# Patient Record
Sex: Female | Born: 1945 | Race: White | Hispanic: No | Marital: Single | Smoking: Former smoker
Health system: Southern US, Community
[De-identification: ages and names within clinical notes are randomized; demographics above are authoritative.]

## PROBLEM LIST (undated history)

## (undated) DIAGNOSIS — E7800 Pure hypercholesterolemia, unspecified: Secondary | ICD-10-CM

## (undated) HISTORY — PX: NECK SURGERY: SHX720

## (undated) HISTORY — PX: BACK SURGERY: SHX140

---

## 2018-11-04 ENCOUNTER — Emergency Department
Admission: EM | Admit: 2018-11-04 | Discharge: 2018-11-04 | Disposition: A | Discharge status: Home / Self Care | Payer: Medicare Other | Attending: Emergency Medicine | Admitting: Emergency Medicine

## 2018-11-04 ENCOUNTER — Encounter: Payer: Self-pay | Admitting: Emergency Medicine

## 2018-11-04 ENCOUNTER — Emergency Department: Payer: Medicare Other

## 2018-11-04 ENCOUNTER — Other Ambulatory Visit: Payer: Self-pay

## 2018-11-04 DIAGNOSIS — S76211A Strain of adductor muscle, fascia and tendon of right thigh, initial encounter: Secondary | ICD-10-CM | POA: Diagnosis not present

## 2018-11-04 DIAGNOSIS — Y998 Other external cause status: Secondary | ICD-10-CM | POA: Diagnosis not present

## 2018-11-04 DIAGNOSIS — Y929 Unspecified place or not applicable: Secondary | ICD-10-CM | POA: Diagnosis not present

## 2018-11-04 DIAGNOSIS — S79921A Unspecified injury of right thigh, initial encounter: Secondary | ICD-10-CM | POA: Diagnosis present

## 2018-11-04 DIAGNOSIS — W19XXXA Unspecified fall, initial encounter: Secondary | ICD-10-CM | POA: Diagnosis not present

## 2018-11-04 DIAGNOSIS — Z87891 Personal history of nicotine dependence: Secondary | ICD-10-CM | POA: Insufficient documentation

## 2018-11-04 DIAGNOSIS — S76219A Strain of adductor muscle, fascia and tendon of unspecified thigh, initial encounter: Secondary | ICD-10-CM

## 2018-11-04 DIAGNOSIS — Y939 Activity, unspecified: Secondary | ICD-10-CM | POA: Insufficient documentation

## 2018-11-04 HISTORY — DX: Pure hypercholesterolemia, unspecified: E78.00

## 2018-11-04 MED ORDER — BACLOFEN 10 MG PO TABS: 10.0000 mg | ORAL_TABLET | Freq: Three times a day (TID) | ORAL | 0 refills | Status: AC

## 2018-11-04 NOTE — ED Provider Notes (Signed)
[facility] Emergency Department Provider Note  ____________________________________________   First MD Initiated Contact with Patient 11/04/18 1236     (approximate)  I have reviewed the triage vital signs and the nursing notes.   HISTORY  Chief Complaint Fall    HPI Emylia Piquette is a 73 y.o. female presents to the emergency department after a fall about 2 weeks ago.  She states she was evaluated [facility] and x-rays were completed.  She states they told her they were negative.  She is continued to have increasing pain in the right hip.  She states it is all in the groin area.  Her family wanted her to be evaluated to make sure she did not have a fracture at the urgent care missed.  She states she is on a pain contract and does not want pain medications.    Past Medical History:  Diagnosis Date  . High cholesterol     There are no active problems to display for this patient.   Past Surgical History:  Procedure Laterality Date  . BACK SURGERY    . NECK SURGERY      Prior to Admission medications   Medication Sig Start Date End Date Taking? Authorizing Provider  DULoxetine (CYMBALTA) 60 MG capsule Take 60 mg by mouth daily.   Yes [provider]  ibuprofen (ADVIL,MOTRIN) 800 MG tablet Take 800 mg by mouth every 8 (eight) hours as needed.   Yes [provider]  oxyCODONE (OXYCONTIN) 10 mg 12 hr tablet Take 5 mg by mouth every 12 (twelve) hours.   Yes [provider]  oxyCODONE-acetaminophen (PERCOCET/ROXICET) 5-325 MG tablet Take 1 tablet by mouth every 4 (four) hours as needed for severe pain.   Yes [provider]  simvastatin (ZOCOR) 40 MG tablet Take 40 mg by mouth daily.   Yes [provider]  baclofen (LIORESAL) 10 MG tablet Take 1 tablet (10 mg total) by mouth 3 (three) times daily. 11/04/18   Kem Boroughs B, FNP    Allergies Cortisone  No family history on file.  Social  History Social History   Tobacco Use  . Smoking status: Former Games developer  . Smokeless tobacco: Never Used  Substance Use Topics  . Alcohol use: Never    Frequency: Never  . Drug use: Not on file    Review of Systems  Constitutional: No fever/chills Eyes: No visual changes. ENT: No sore throat. Respiratory: Denies cough Genitourinary: Negative for dysuria. Musculoskeletal: Positive for back pain and right hip pain Skin: Negative for rash.    ____________________________________________   PHYSICAL EXAM:  VITAL SIGNS: ED Triage Vitals [11/04/18 1227]  Enc Vitals Group     BP 130/84     Pulse Rate 82     Resp 16     Temp 98.2 F (36.8 C)     Temp Source Oral     SpO2 95 %     Weight 120 lb (54.4 kg)     Height 5\' 1"  (1.549 m)     Head Circumference      Peak Flow      Pain Score 10     Pain Loc      Pain Edu?      Excl. in GC?     Constitutional: Alert and oriented. Well appearing and in no acute distress. Eyes: Conjunctivae are normal.  Head: Atraumatic. Nose: No congestion/rhinnorhea. Mouth/Throat: Mucous membranes are moist.   Neck:  supple no  lymphadenopathy noted Cardiovascular: Normal rate, regular rhythm. Heart sounds are normal Respiratory: Normal respiratory effort.  No retractions, lungs c t a  Abd: soft nontender bs normal all 4 quad GU: deferred Musculoskeletal: Lumbar spine is minimally tender.  Right hip and groin area are tender to palpation.  Pain is reproduced with range of motion of the right hip.  The knee is not tender.  The left hip is not tender and has good range of motion.  Neurovascular is intact.   Neurologic:  Normal speech and language.  Skin:  Skin is warm, dry and intact. No rash noted. Psychiatric: Mood and affect are normal. Speech and behavior are normal.  ____________________________________________   LABS (all labs ordered are listed, but only abnormal results are displayed)  Labs Reviewed - No data to display  ____________________________________________   ____________________________________________  RADIOLOGY  CT of the lumbar spine and right hip were ordered due to the previous negative x-rays, CT results are negative for fractures  ____________________________________________   PROCEDURES  Procedure(s) performed: No  Procedures    ____________________________________________   INITIAL IMPRESSION / ASSESSMENT AND PLAN / ED COURSE  Pertinent labs & imaging results that were available during my care of the patient were reviewed by me and considered in my medical decision making (see chart for details).   Patient is a 73 year old female presents emergency department complaining of low back and right hip pain after a fall 2 weeks ago.  She is already been seen in urgent care and had negative x-rays.  Physical exam shows a right hip and lumbar spine being minimally tender.  CT of the lumbar spine and right hip are both negative for fracture.  Patient was discharged by [facility] via my instructions.     As part of my medical decision making, I reviewed the following data within the electronic MEDICAL RECORD NUMBER Nursing notes reviewed and incorporated, Old chart reviewed, Notes from prior ED visits and [place] Controlled Substance Database  ____________________________________________   FINAL CLINICAL IMPRESSION(S) / ED DIAGNOSES  Final diagnoses:  Groin strain, initial encounter      NEW MEDICATIONS STARTED DURING THIS VISIT:  New Prescriptions   BACLOFEN (LIORESAL) 10 MG TABLET    Take 1 tablet (10 mg total) by mouth 3 (three) times daily.     Note:  This document was prepared using Dragon voice recognition software and may include unintentional dictation errors.    Faythe Ghee, PA-C 11/04/18 1429    Dionne Bucy, MD 11/04/18 2115

## 2018-11-04 NOTE — ED Notes (Signed)
See triage note  Presents s/p fall about 2 weeks ago  Landed on right side  Was seen at Urgent care about 4 days later    conts to have pain  But states pain is mainly in right groin area  States she is able to bear some wt  Ambulates slowly with limp

## 2018-11-04 NOTE — ED Triage Notes (Signed)
Patient presents to the ED with right sided pelvic pain after she fell over her grandson's sneaker on March 15th.  Patient states she was seen at Urgent Care and had x-rays performed.  Patient was instructed to come to the ED for an MRI but patient states she was afraid to come but pain has been increasing.

## 2018-11-04 NOTE — ED Notes (Signed)
First nurse note   Presents s/p fall about 1 week ago

## 2020-04-01 IMAGING — CT CT OF THE RIGHT HIP WITHOUT CONTRAST
2 of 3 series · 17 of 46 positions shown, 19 images · non-contrast
Comparison: None.

CLINICAL DATA: Right hip pain since a fall 2 weeks ago. Initial
encounter.

EXAM:
CT OF THE RIGHT HIP WITHOUT CONTRAST
TECHNIQUE: Multidetector CT imaging of the right hip was performed according to
the standard protocol. Multiplanar CT image reconstructions were
also generated.

[Series 3: axial st · axial · 0.43mm/px · z∈[-502,-358]mm · 14 of 84 slices shown, 16 images]
[im 6/84  soft-tissue]
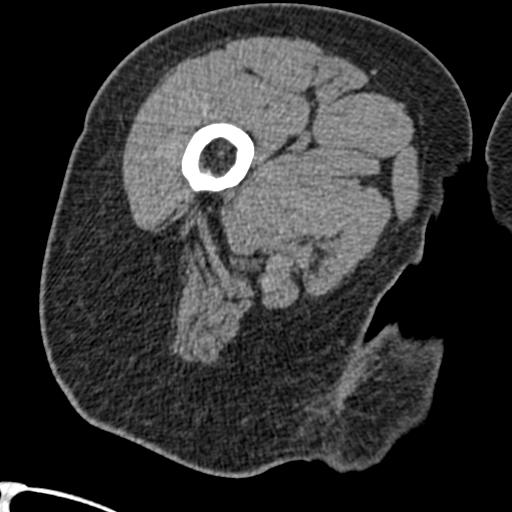
[im 6/84  bone]
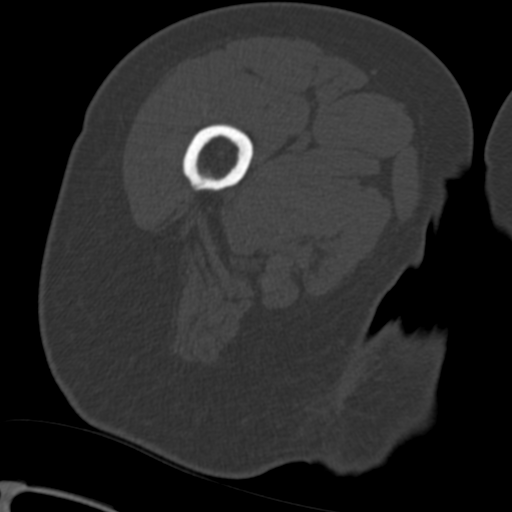
[im 11/84  soft-tissue]
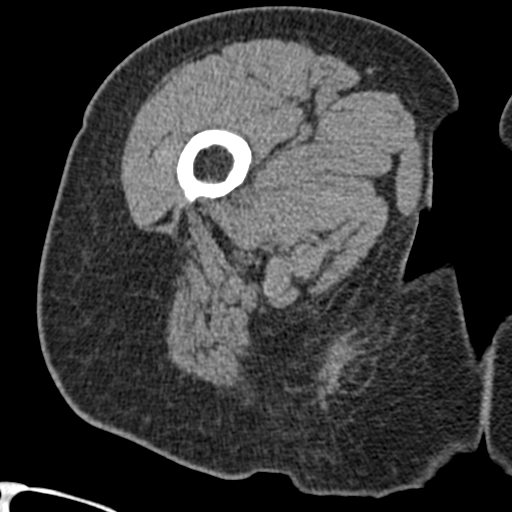
[im 17/84  soft-tissue]
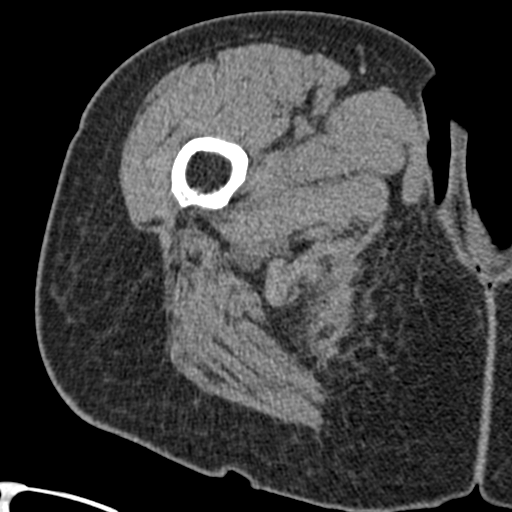
[im 22/84  soft-tissue]
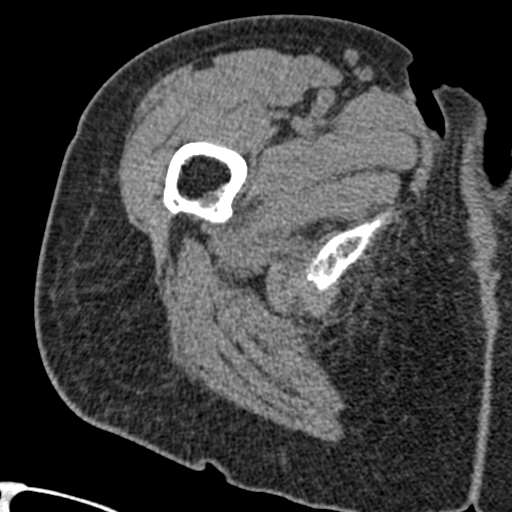
[im 27/84  soft-tissue]
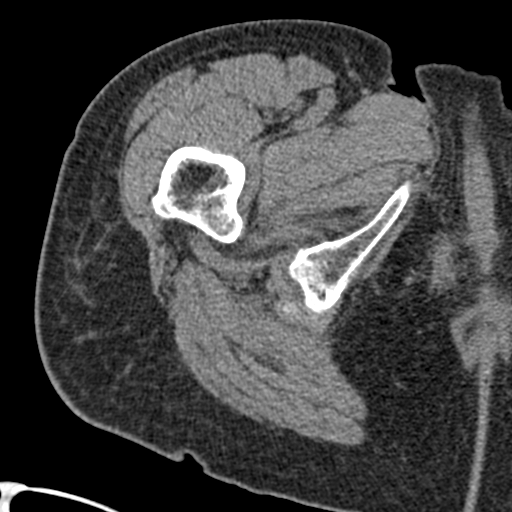
[im 33/84  soft-tissue]
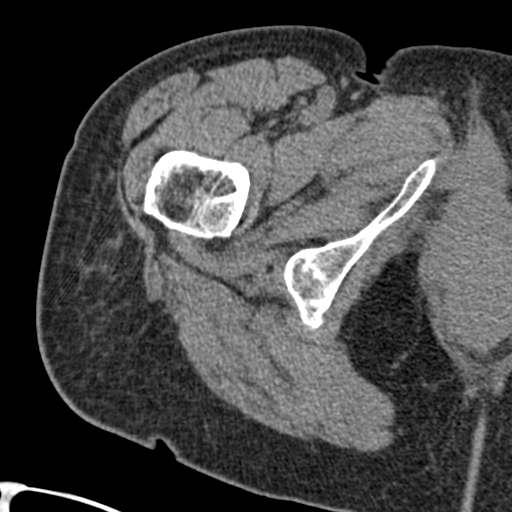
[im 38/84  soft-tissue]
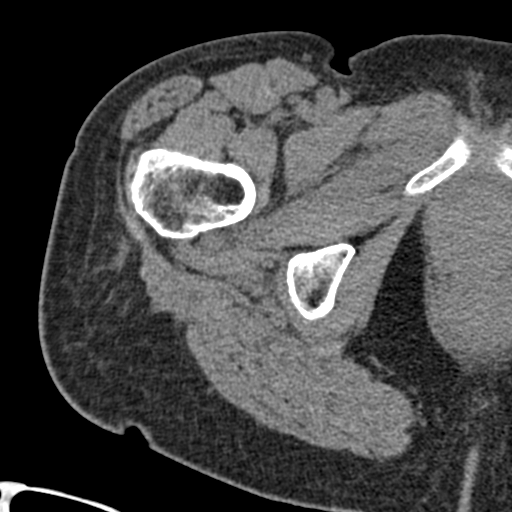
[im 46/84  soft-tissue]
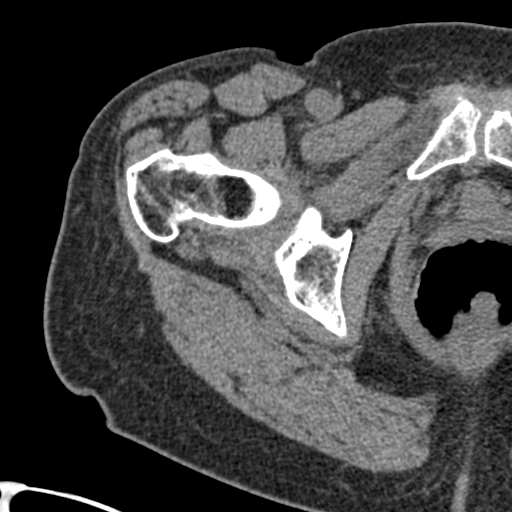
[im 51/84  soft-tissue]
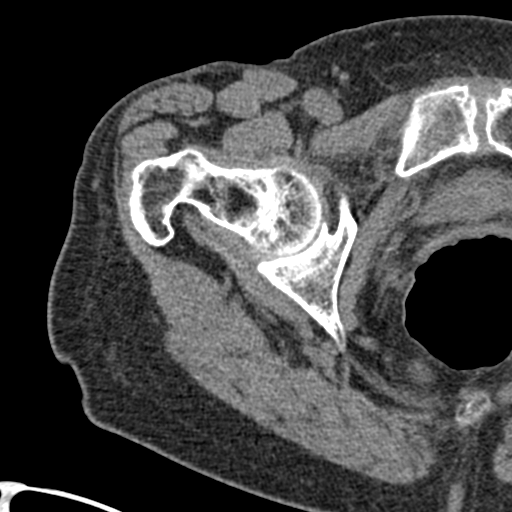
[im 51/84  bone]
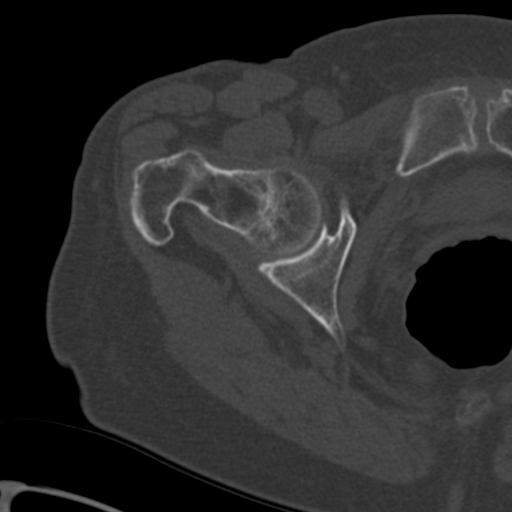
[im 57/84  soft-tissue]
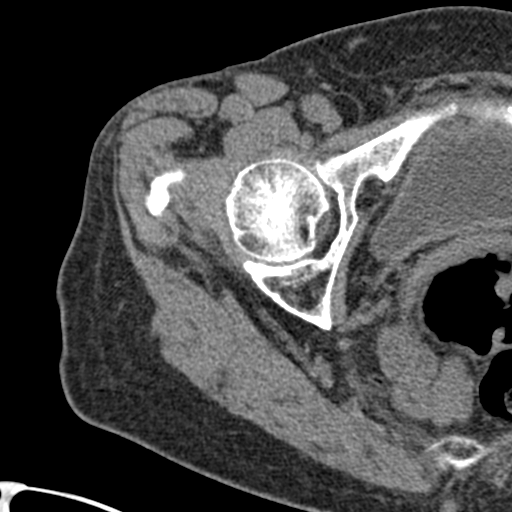
[im 62/84  soft-tissue]
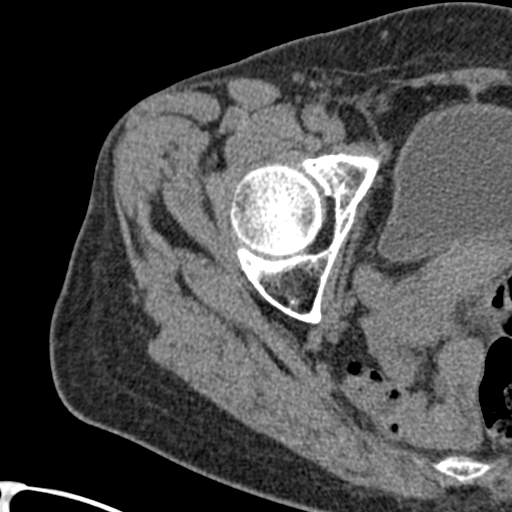
[im 67/84  soft-tissue]
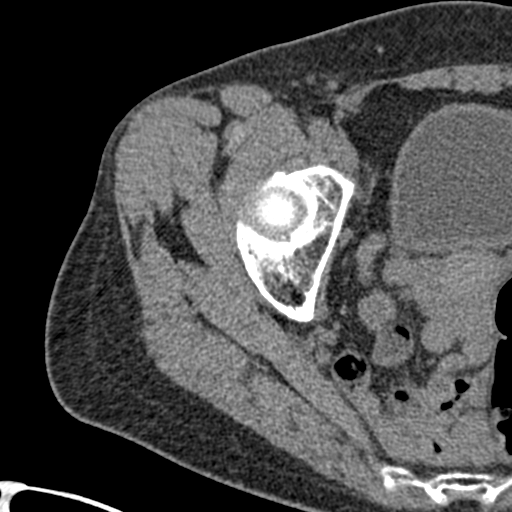
[im 73/84  soft-tissue]
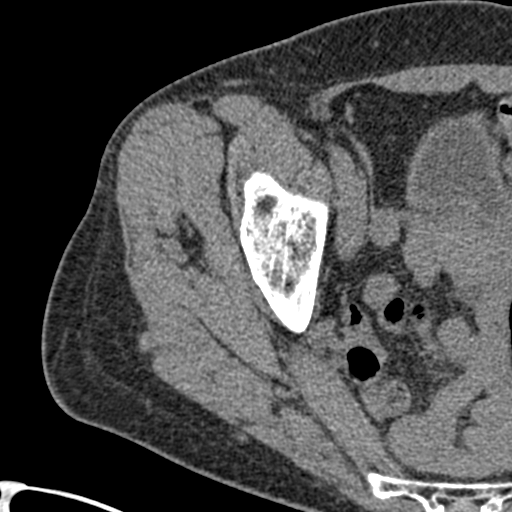
[im 78/84  soft-tissue]
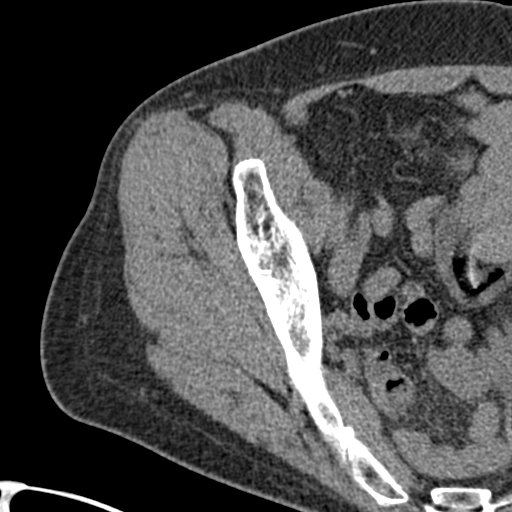

[Series 6: coronal st · coronal · 0.35mm/px · 3 of 102 slices shown]
[im 34/102  soft-tissue]
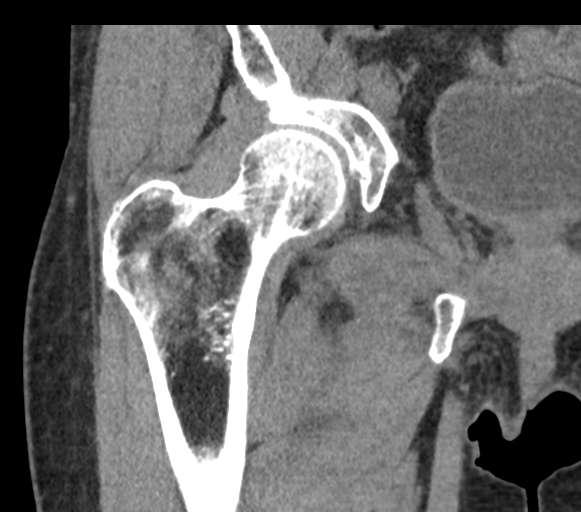
[im 45/102  soft-tissue]
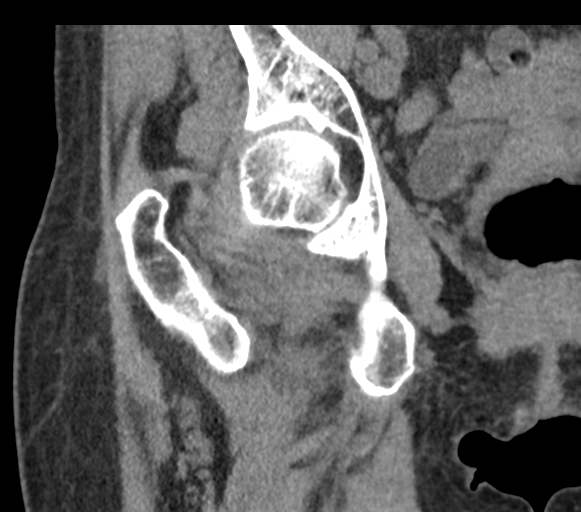
[im 57/102  soft-tissue]
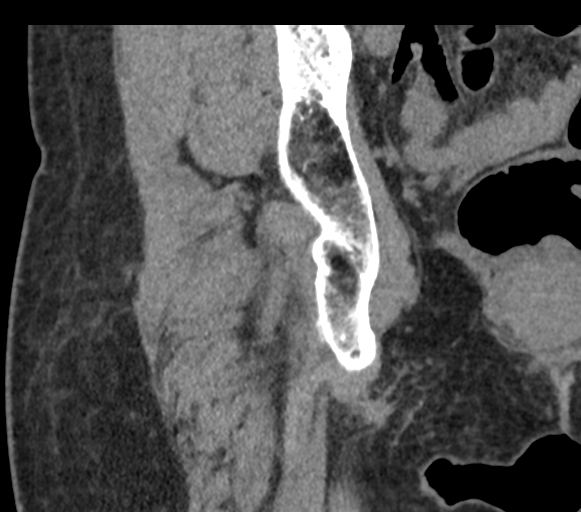

[17 of 46 positions shown; findings below may reference images not displayed]

FINDINGS: Bones/Joint/Cartilage

There is no fracture or dislocation. No focal bony lesion. No
avascular necrosis of the femoral head. No subchondral cyst
formation, sclerosis or osteophytosis about the hip. No joint
effusion. Mild degenerative change is seen at the symphysis pubis.
Degenerative ankylosis of the right SI joint is noted.

Ligaments

Suboptimally assessed by CT.

Muscles and Tendons

Intact and normal in appearance.

Soft tissues

Negative.
IMPRESSION: Negative exam.  No finding to explain the patient's hip pain.
